# Patient Record
Sex: Female | Born: 2000 | Race: White | Hispanic: No | Marital: Single | Smoking: Never smoker
Health system: Southern US, Community
[De-identification: ages and names within clinical notes are randomized; demographics above are authoritative.]

## PROBLEM LIST (undated history)

## (undated) DIAGNOSIS — D6851 Activated protein C resistance: Secondary | ICD-10-CM

## (undated) DIAGNOSIS — S060X0A Concussion without loss of consciousness, initial encounter: Secondary | ICD-10-CM

## (undated) DIAGNOSIS — J45990 Exercise induced bronchospasm: Secondary | ICD-10-CM

## (undated) DIAGNOSIS — J45909 Unspecified asthma, uncomplicated: Secondary | ICD-10-CM

## (undated) DIAGNOSIS — G43909 Migraine, unspecified, not intractable, without status migrainosus: Secondary | ICD-10-CM

## (undated) HISTORY — DX: Migraine, unspecified, not intractable, without status migrainosus: G43.909

## (undated) HISTORY — DX: Exercise induced bronchospasm: J45.990

## (undated) HISTORY — PX: NO PAST SURGERIES: SHX2092

## (undated) HISTORY — DX: Concussion without loss of consciousness, initial encounter: S06.0X0A

## (undated) HISTORY — DX: Unspecified asthma, uncomplicated: J45.909

---
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
## 2021-01-01 NOTE — Telephone Encounter (Signed)
That's crazy. Is any other albuterol covered?

## 2021-01-01 NOTE — Telephone Encounter (Signed)
Axid 150 mg BID to control reflux please. Thank you

## 2021-01-02 ENCOUNTER — Other Ambulatory Visit: Payer: Self-pay

## 2021-01-02 MED ORDER — NIZATIDINE 150 MG PO CAPS: 150.0000 mg | ORAL_CAPSULE | Freq: Two times a day (BID) | ORAL | 5 refills | Status: DC

## 2021-01-02 NOTE — Telephone Encounter (Signed)
Left a message for patient to call the office in regards to medication change.  

## 2021-01-02 NOTE — Telephone Encounter (Signed)
It looks like just the levalbuterol is.

## 2021-01-06 NOTE — Telephone Encounter (Signed)
That makes no sense. Please order levalbuterol 2 puffs once every 6 hours as needed for shortness of breath, cough, or wheeze

## 2021-01-13 ENCOUNTER — Other Ambulatory Visit: Payer: Self-pay

## 2021-01-13 MED ORDER — LEVALBUTEROL TARTRATE 45 MCG/ACT IN AERO: 2.0000 | INHALATION_SPRAY | Freq: Four times a day (QID) | RESPIRATORY_TRACT | 1 refills | Status: DC | PRN

## 2021-06-22 ENCOUNTER — Other Ambulatory Visit: Payer: Self-pay | Admitting: Family Medicine

## 2021-12-15 ENCOUNTER — Encounter (HOSPITAL_BASED_OUTPATIENT_CLINIC_OR_DEPARTMENT_OTHER): Payer: Self-pay

## 2021-12-15 ENCOUNTER — Emergency Department (HOSPITAL_BASED_OUTPATIENT_CLINIC_OR_DEPARTMENT_OTHER)
Admission: EM | Admit: 2021-12-15 | Discharge: 2021-12-15 | Disposition: A | Discharge status: Home / Self Care | Payer: No Typology Code available for payment source | Attending: Emergency Medicine | Admitting: Emergency Medicine

## 2021-12-15 ENCOUNTER — Other Ambulatory Visit: Payer: Self-pay

## 2021-12-15 DIAGNOSIS — R55 Syncope and collapse: Secondary | ICD-10-CM | POA: Diagnosis present

## 2021-12-15 DIAGNOSIS — R109 Unspecified abdominal pain: Secondary | ICD-10-CM | POA: Diagnosis not present

## 2021-12-15 DIAGNOSIS — R110 Nausea: Secondary | ICD-10-CM | POA: Insufficient documentation

## 2021-12-15 LAB — URINALYSIS, ROUTINE W REFLEX MICROSCOPIC
Bilirubin Urine: NEGATIVE
Glucose, UA: NEGATIVE mg/dL
Hgb urine dipstick: NEGATIVE
Ketones, ur: NEGATIVE mg/dL
Leukocytes,Ua: NEGATIVE
Nitrite: NEGATIVE
Protein, ur: NEGATIVE mg/dL
Specific Gravity, Urine: >1.03 — ABNORMAL HIGH (ref 1.005–1.030)
pH: 6.5 (ref 5.0–8.0)

## 2021-12-15 LAB — BASIC METABOLIC PANEL
Anion gap: 8 (ref 5–15)
BUN: 15 mg/dL (ref 6–20)
CO2: 25 mmol/L (ref 22–32)
Calcium: 9.3 mg/dL (ref 8.9–10.3)
Chloride: 104 mmol/L (ref 98–111)
Creatinine, Ser: 0.86 mg/dL (ref 0.44–1.00)
GFR, Estimated: >60 mL/min (ref >60)
Glucose, Bld: 101 mg/dL — ABNORMAL HIGH (ref 70–99)
Potassium: 3.9 mmol/L (ref 3.5–5.1)
Sodium: 137 mmol/L (ref 135–145)

## 2021-12-15 LAB — CBC
HCT: 45.7 % (ref 36.0–46.0)
Hemoglobin: 15.9 g/dL — ABNORMAL HIGH (ref 12.0–15.0)
MCH: 32.1 pg (ref 26.0–34.0)
MCHC: 34.8 g/dL (ref 30.0–36.0)
MCV: 92.3 fL (ref 80.0–100.0)
Platelets: 180 10*3/uL (ref 150–400)
RBC: 4.95 MIL/uL (ref 3.87–5.11)
RDW: 12.2 % (ref 11.5–15.5)
WBC: 8.7 10*3/uL (ref 4.0–10.5)
nRBC: 0 % (ref 0.0–0.2)

## 2021-12-15 LAB — CBG MONITORING, ED: Glucose-Capillary: 88 mg/dL (ref 70–99)

## 2021-12-15 LAB — PREGNANCY, URINE: Preg Test, Ur: NEGATIVE

## 2021-12-15 MED ORDER — SODIUM CHLORIDE 0.9 % IV BOLUS
1000.0000 mL | Freq: Once | INTRAVENOUS | Status: AC
  Administered 2021-12-15: 1000 mL via INTRAVENOUS

## 2021-12-15 MED ORDER — SODIUM CHLORIDE 0.9 % IV SOLN: 1.0000 g | Freq: Once | INTRAVENOUS | Status: DC

## 2021-12-15 NOTE — ED Triage Notes (Signed)
Pt reports near syncopal episode ~1hour PTA, ad pain, nausea after event-NAD-steady gait ?

## 2021-12-15 NOTE — ED Notes (Signed)
C/o nausea at this time, last PO intake at approx 0830hr today ?

## 2021-12-15 NOTE — ED Notes (Signed)
Pt ambulatory to restroom. Steady gait. No issues observed.  °

## 2021-12-15 NOTE — Discharge Instructions (Signed)
Drink plenty of fluids.  Return if any problems.   

## 2021-12-19 NOTE — ED Provider Notes (Signed)
?[facility] EMERGENCY DEPARTMENT ?Provider Note ? ? ?CSN: [id number] ?Arrival date & time: 12/15/21  1059 ? ?  ? ?History ? ?Chief Complaint  ?Patient presents with  ? Near Syncope  ? ? ?Veronica Villegas is a 21 y.o. female. ? ?Pt reports she felt like she was going to pass out.  Pt report she felt like she was going to pass out.  Pt reports she felt nauseated and had some abdominal cramping.  Pt denies any current symptoms.  Pt reports she did not any chest pain.  Patient denies any headache she has not had any vomiting she denies any diarrhea patient denies any cough. ? ?The history is provided by the patient. No language interpreter was used.  ?Near Syncope ?This is a new problem. The problem occurs constantly. The problem has not changed since onset.Pertinent negatives include no chest pain, no abdominal pain, no headaches and no shortness of breath. Nothing aggravates the symptoms. Nothing relieves the symptoms. She has tried nothing for the symptoms.  ? ?  ? ?Home Medications ?Prior to Admission medications   ?Medication Sig Start Date End Date Taking? Authorizing Provider  ?levalbuterol [facility] HFA) 45 MCG/ACT inhaler Inhale 2 puffs into the lungs every 6 (six) hours as needed for wheezing or shortness of breath. 01/13/21   Ambs, Norvel Richards, FNP  ?albuterol (PROVENTIL) (2.5 MG/3ML) 0.083% nebulizer solution Take 3 mLs (2.5 mg total) by nebulization every 4 (four) hours as needed for wheezing or shortness of breath. 12/30/20   Hetty Blend, FNP  ?cyclobenzaprine (FLEXERIL) 10 MG tablet Take 1 tablet 2 hours before bedtime (start after finishing Klonopin) 11/10/18   [provider]  ?famotidine (PEPCID) 20 MG tablet Take 1 tablet twice daily to prevent reflux 12/30/20   Ambs, Norvel Richards, FNP  ?fluticasone (FLONASE) 50 MCG/ACT nasal spray 1-2 sprays per nostril once a day as needed for a stuffy nose 12/30/20   Ambs, Norvel Richards, FNP  ?fluticasone (FLOVENT HFA) 110 MCG/ACT inhaler Inhale 2 puffs twice a day with  spacer ?Patient not taking: Reported on 12/30/2020 05/02/20   Nehemiah Settle, FNP  ?Fluticasone Propionate,sensor, (ARMONAIR DIGIHALER) 113 MCG/ACT AEPB 2 puffs twice a day to prevent coughing or wheezing. 12/30/20   Hetty Blend, FNP  ?magnesium oxide (MAG-OX) 400 MG tablet Take 400 mg by mouth daily. ?Patient not taking: Reported on 12/30/2020    [provider]  ?montelukast (SINGULAIR) 10 MG tablet TAKE 1 TABLET ONCE A DAY FOR COUGHING OR WHEEZING. 12/17/20   Ambs, Norvel Richards, FNP  ?nizatidine (AXID) 150 MG capsule Take 1 capsule (150 mg total) by mouth 2 (two) times daily. 01/02/21   Hetty Blend, FNP  ?   ? ?Allergies    ?Patient has no known allergies.   ? ?Review of Systems   ?Review of Systems  ?Respiratory:  Negative for shortness of breath.   ?Cardiovascular:  Positive for near-syncope. Negative for chest pain.  ?Gastrointestinal:  Negative for abdominal pain.  ?Neurological:  Negative for headaches.  ?All other systems reviewed and are negative. ? ?Physical Exam ?Updated Vital Signs ?BP 115/75   Pulse 64   Temp 98 ?F (36.7 ?C) (Oral)   Resp 16   Ht 5\' 7"  (1.702 m)   Wt 69.4 kg   LMP 11/28/2021   SpO2 100%   BMI 23.96 kg/m?  ?Physical Exam ?Vitals and nursing note reviewed.  ?Constitutional:   ?   Appearance: She is well-developed.  ?HENT:  ?  Head: Normocephalic.  ?   Nose: Nose normal.  ?   Mouth/Throat:  ?   Mouth: Mucous membranes are moist.  ?Eyes:  ?   Extraocular Movements: Extraocular movements intact.  ?   Pupils: Pupils are equal, round, and reactive to light.  ?Cardiovascular:  ?   Rate and Rhythm: Normal rate.  ?Pulmonary:  ?   Effort: Pulmonary effort is normal.  ?Abdominal:  ?   General: Abdomen is flat. There is no distension.  ?Musculoskeletal:     ?   General: Normal range of motion.  ?   Cervical back: Normal range of motion.  ?Skin: ?   General: Skin is warm.  ?Neurological:  ?   General: No focal deficit present.  ?   Mental Status: She is alert and oriented to person, place,  and time.  ? ? ?ED Results / Procedures / Treatments   ?Labs ?(all labs ordered are listed, but only abnormal results are displayed) ?Labs Reviewed  ?BASIC METABOLIC PANEL - Abnormal; Notable for the following components:  ?    Result Value  ? Glucose, Bld 101 (*)   ? All other components within normal limits  ?CBC - Abnormal; Notable for the following components:  ? Hemoglobin 15.9 (*)   ? All other components within normal limits  ?URINALYSIS, ROUTINE W REFLEX MICROSCOPIC - Abnormal; Notable for the following components:  ? Specific Gravity, Urine >1.030 (*)   ? All other components within normal limits  ?PREGNANCY, URINE  ?CBG MONITORING, ED  ? ? ?EKG ?EKG Interpretation ? ?Date/Time:  Monday December 15 2021 11:24:38 EDT ?Ventricular Rate:  75 ?PR Interval:  138 ?QRS Duration: 84 ?QT Interval:  368 ?QTC Calculation: 410 ?R Axis:   71 ?Text Interpretation: Normal sinus rhythm with sinus arrhythmia Nonspecific T wave abnormality Abnormal ECG When compared with ECG of 18-May-2019 22:43, No significant change since last tracing Confirmed by Linwood Dibbles [phone]) on 12/15/2021 11:32:32 AM ? ?Radiology ?No results found. ? ?Procedures ?Procedures  ? ? ?Medications Ordered in ED ?Medications  ?sodium chloride 0.9 % bolus 1,000 mL (0 mLs Intravenous Stopped 12/15/21 1308)  ? ? ?ED Course/ Medical Decision Making/ A&P ?  ?                        ?Medical Decision Making ?Patient reports feeling lightheaded and almost passing out ? ?Amount and/or Complexity of Data Reviewed ?Independent Historian: parent ?   Details: Patient is here with parent who reports patient has been acting normally ?Labs: ordered. Decision-making details documented in ED Course. ?   Details: Pregnancy is negative hemoglobin is slightly elevated at 15.9 ? ?Risk ?Risk Details: Counseled on results she is advised to follow-up with primary care patient is encouraged to drink lots of fluids she is to return to the department if symptoms worsen or  change ? ? ? ? ? ? ? ? ? ? ?Final Clinical Impression(s) / ED Diagnoses ?Final diagnoses:  ?Near syncope  ? ? ?Rx / DC Orders ?ED Discharge Orders   ? ? None  ? ?  ? ? ?  ?Elson Areas, [place] ?12/19/21 1455 ? ?  ?Linwood Dibbles, MD ?12/23/21 1456 ? ?

## 2022-01-10 ENCOUNTER — Other Ambulatory Visit: Payer: Self-pay | Admitting: Family Medicine

## 2022-02-03 ENCOUNTER — Other Ambulatory Visit: Payer: Self-pay | Admitting: Family Medicine

## 2022-03-10 ENCOUNTER — Other Ambulatory Visit: Payer: Self-pay | Admitting: Family Medicine

## 2022-03-11 NOTE — Patient Instructions (Addendum)
Asthma  Continue montelukast 10 mg once a day to help prevent cough and wheeze. Continue levalbuterol 2 puffs every 4 hours as needed for cough, wheeze, tightness in chest, or shortness of breath. You may use levalbuterol 5-15 minutes before activity to decrease cough or wheeze For asthma flare, begin Flovent 110-2 puffs twice a day with a spacer for 2 weeks or until cough and wheeze free  Chronic rhinitis Continue Zyrtec (cetirizine) 10 mg once a day as needed for runny nose or itching. Continue Flonase 1-2 sprays in each nostril once a day as needed for a stuffy nose.  In the right nostril, point the applicator out toward the right ear. In the left nostril, point the applicator out toward the left ear Consider saline nasal rinses as needed for nasal symptoms. Use this before any medicated nasal sprays for best result Consider updating your environmental allergy testing.  Remember to stop antihistamines for 3 days before your testing appointment.  Hives (urticaria) Well controlledUse the lowest amount of medication while remaining hive free Cetirizine (Zyrtec) 10mg  twice a day and famotidine (Pepcid) 20 mg twice a day. If no symptoms for 7-14 days then decrease to. Cetirizine (Zyrtec) 10mg  twice a day and famotidine (Pepcid) 20 mg once a day.  If no symptoms for 7-14 days then decrease to. Cetirizine (Zyrtec) 10mg  twice a day.  If no symptoms for 7-14 days then decrease to. Cetirizine (Zyrtec) 10mg  once a day.  May use Benadryl (diphenhydramine) as needed for breakthrough hives       If symptoms return, then step up dosage If your symptoms re-occur, begin a journal of events that occurred for up to 6 hours before your symptoms began including foods and beverages consumed, soaps or perfumes you had contact with, and medications.   Call the clinic if this treatment plan is not working well for you  Follow up in 6 months or sooner if needed.

## 2022-03-11 NOTE — Progress Notes (Unsigned)
   [address] [place] [address] Dept: [phone]  FOLLOW UP NOTE  Patient ID: Veronica Villegas, female    DOB: May 11, 2001  Age: 21 y.o. MRN: [id number] Date of Office Visit: 03/12/2022  Assessment  Chief Complaint: No chief complaint on file.  HPI Veronica Villegas is a 21 year old female who presents to the clinic for a follow up visit. She was last seen in this clinic on 12/30/2020 by Thermon Leyland, FNP for evaluation of asthma, allergic rhinitis, urticaria, and reflux.   Drug Allergies:  No Known Allergies  Physical Exam: There were no vitals taken for this visit.   Physical Exam  Diagnostics:    Assessment and Plan: No diagnosis found.  No orders of the defined types were placed in this encounter.   There are no Patient Instructions on file for this visit.  No follow-ups on file.    Thank you for the opportunity to care for this patient.  Please do not hesitate to contact me with questions.  Thermon Leyland, FNP [facility]

## 2022-03-12 ENCOUNTER — Ambulatory Visit (INDEPENDENT_AMBULATORY_CARE_PROVIDER_SITE_OTHER): Payer: No Typology Code available for payment source | Admitting: Family Medicine

## 2022-03-12 ENCOUNTER — Encounter: Payer: Self-pay | Admitting: Family Medicine

## 2022-03-12 VITALS — BP 118/64 | HR 84 | Temp 98.8°F | Resp 18 | Ht 67.0 in | Wt 152.0 lb

## 2022-03-12 DIAGNOSIS — J4540 Moderate persistent asthma, uncomplicated: Secondary | ICD-10-CM | POA: Diagnosis not present

## 2022-03-12 DIAGNOSIS — J3089 Other allergic rhinitis: Secondary | ICD-10-CM | POA: Diagnosis not present

## 2022-03-12 DIAGNOSIS — L282 Other prurigo: Secondary | ICD-10-CM | POA: Diagnosis not present

## 2022-03-12 MED ORDER — MONTELUKAST SODIUM 10 MG PO TABS: ORAL_TABLET | ORAL | 0 refills | Status: DC

## 2022-03-12 MED ORDER — LEVALBUTEROL TARTRATE 45 MCG/ACT IN AERO
2.0000 | INHALATION_SPRAY | Freq: Four times a day (QID) | RESPIRATORY_TRACT | 1 refills | Status: DC | PRN
Start: 2022-03-12 — End: 2022-06-22

## 2022-03-12 MED ORDER — FLUTICASONE PROPIONATE HFA 110 MCG/ACT IN AERO: INHALATION_SPRAY | RESPIRATORY_TRACT | 5 refills | Status: DC

## 2022-04-03 ENCOUNTER — Other Ambulatory Visit: Payer: Self-pay | Admitting: Family Medicine

## 2022-04-24 ENCOUNTER — Other Ambulatory Visit: Payer: Self-pay | Admitting: Family Medicine

## 2022-06-01 ENCOUNTER — Other Ambulatory Visit: Payer: Self-pay | Admitting: Family Medicine

## 2022-06-01 NOTE — Telephone Encounter (Signed)
Please advise to change to pulmicort as it is preferred

## 2022-06-08 ENCOUNTER — Other Ambulatory Visit: Payer: Self-pay

## 2022-06-08 MED ORDER — BUDESONIDE 90 MCG/ACT IN AEPB: INHALATION_SPRAY | RESPIRATORY_TRACT | 5 refills | Status: DC

## 2022-06-08 NOTE — Telephone Encounter (Signed)
Alternative of pulmicort was sent to pharmacy. Per anne.

## 2022-06-08 NOTE — Telephone Encounter (Signed)
Alternative meds per Webb Silversmith was sent to pt pharmacy.

## 2022-06-08 NOTE — Telephone Encounter (Signed)
For asthma flare, begin Pulmicort 90- 2 puffs twice a day for 2 weeks or until cough and wheeze free, then stop. Thank you

## 2022-06-19 ENCOUNTER — Other Ambulatory Visit: Payer: Self-pay | Admitting: Family Medicine

## 2022-06-23 ENCOUNTER — Other Ambulatory Visit: Payer: Self-pay | Admitting: Family Medicine

## 2022-06-23 NOTE — Telephone Encounter (Signed)
Albuterol inhaler is on backorder. Please advise on switching to the levalbuterol inhaler .

## 2022-06-24 NOTE — Telephone Encounter (Signed)
Please switch to levalbuterol

## 2022-06-29 ENCOUNTER — Other Ambulatory Visit: Payer: Self-pay | Admitting: Family Medicine

## 2022-07-15 ENCOUNTER — Encounter: Payer: Self-pay | Admitting: Internal Medicine

## 2022-07-15 ENCOUNTER — Ambulatory Visit (INDEPENDENT_AMBULATORY_CARE_PROVIDER_SITE_OTHER): Payer: No Typology Code available for payment source | Admitting: Internal Medicine

## 2022-07-15 VITALS — BP 112/70 | HR 75 | Temp 97.9°F | Resp 18 | Wt 151.2 lb

## 2022-07-15 DIAGNOSIS — R21 Rash and other nonspecific skin eruption: Secondary | ICD-10-CM

## 2022-07-15 DIAGNOSIS — L505 Cholinergic urticaria: Secondary | ICD-10-CM

## 2022-07-15 MED ORDER — TRIAMCINOLONE ACETONIDE 0.1 % EX OINT
1.0000 | TOPICAL_OINTMENT | Freq: Two times a day (BID) | CUTANEOUS | 3 refills | Status: DC
Start: 2022-07-15 — End: 2023-04-29

## 2022-07-15 MED ORDER — FEXOFENADINE HCL 180 MG PO TABS: 180.0000 mg | ORAL_TABLET | Freq: Two times a day (BID) | ORAL | 5 refills | Status: DC

## 2022-07-15 NOTE — Patient Instructions (Addendum)
Rash - Do a daily soaking tub bath in warm water for 10-15 minutes.  - Use a gentle, unscented cleanser at the end of the bath (such as Dove unscented bar or baby wash, or Aveeno sensitive body wash). Then rinse, pat half-way dry, and apply a gentle, unscented moisturizer cream or ointment (Cerave, Cetaphil, Eucerin, Aveeno) all over while still damp. Dry skin makes the itching and rash of eczema worse. The skin should be moisturized with a gentle, unscented moisturizer at least twice daily.  - Use only unscented liquid laundry detergent. - Apply prescribed topical steroid (triamcinolone 0.1% below neck) to flared areas after the moisturizer has soaked into the skin (wait at least 30 minutes). Taper off the topical steroids as the skin improves. Do not use topical steroid for more than 7-10 days at a time.  - Start Allegra 180mg  twice daily.  Return in about 8 weeks (around 09/09/2022).

## 2022-07-15 NOTE — Progress Notes (Signed)
   FOLLOW UP Date of Service/Encounter:  07/15/22   Subjective:  Veronica Villegas (DOB: December 22, 2000) is a 21 y.o. female who returns to the [facility] on 07/15/2022 for an acute visit for rash.  Last seen 02/2022 with Gareth Morgan for moderate persistent asthma, allergic rhinitis and urticaria.    History obtained from: chart review and patient. Reports onset of rash about 1 day ago.  It is bumpy and red, sometimes itchy.  It is mostly on her upper/lower back, arms and neck.  Has used benadryl x1 without improvement.  She uses [place] lotion to moisturize.  No new products or medications.  No illness.  No insect bites.   Reports asthma and rhinitis are stable.  Past Medical History: Past Medical History:  Diagnosis Date   Asthma    Concussion with no loss of consciousness    Exercise induced bronchospasm    Factor 5 Leiden mutation, heterozygous ([place])    dx'd 2wks by headache clinic   Migraines     Objective:  BP 112/70 (BP Location: Right Arm, Patient Position: Sitting, Cuff Size: Normal)   Pulse 75   Temp 97.9 F (36.6 C) (Temporal)   Resp 18   Wt 151 lb 3.2 oz (68.6 kg)   SpO2 100%   BMI 23.68 kg/m  Body mass index is 23.68 kg/m. Physical Exam: GEN: alert, well developed HEENT: clear conjunctiva, TM grey and translucent, nose with moderate inferior turbinate hypertrophy, pink  nasal mucosa, no rhinorrhea, no cobblestoning HEART: regular rate and rhythm, no murmur LUNGS: clear to auscultation bilaterally, no coughing, unlabored respiration SKIN: papular rash on back, neck and arms, dry skin present    Assessment/Plan   Rash - Unclear etiology, possibly urticarial or keratosis pilaris or contact dermatitis. If persistent, might need to consider patch testing  - Do a daily soaking tub bath in warm water for 10-15 minutes.  - Use a gentle, unscented cleanser at the end of the bath (such as Dove unscented bar or baby wash, or Aveeno sensitive body wash). Then  rinse, pat half-way dry, and apply a gentle, unscented moisturizer cream or ointment (Cerave, Cetaphil, Eucerin, Aveeno) all over while still damp. Dry skin makes the itching and rash of eczema worse. The skin should be moisturized with a gentle, unscented moisturizer at least twice daily.  - Use only unscented liquid laundry detergent. - Apply prescribed topical steroid (triamcinolone 0.1% below neck) to flared areas after the moisturizer has soaked into the skin (wait at least 30 minutes). Taper off the topical steroids as the skin improves. Do not use topical steroid for more than 7-10 days at a time.  - Start Allegra 180mg  twice daily.   Return in about 8 weeks (around 09/09/2022). Harlon Flor, MD  [facility]

## 2022-09-09 ENCOUNTER — Ambulatory Visit: Payer: No Typology Code available for payment source | Admitting: Internal Medicine

## 2022-11-09 ENCOUNTER — Other Ambulatory Visit: Payer: Self-pay | Admitting: Family Medicine

## 2023-04-29 ENCOUNTER — Ambulatory Visit: Payer: No Typology Code available for payment source | Admitting: Internal Medicine

## 2023-04-29 ENCOUNTER — Encounter: Payer: Self-pay | Admitting: Internal Medicine

## 2023-04-29 VITALS — BP 90/54 | HR 82 | Temp 98.1°F | Resp 16 | Ht 67.0 in | Wt 149.5 lb

## 2023-04-29 DIAGNOSIS — J3089 Other allergic rhinitis: Secondary | ICD-10-CM

## 2023-04-29 DIAGNOSIS — J4540 Moderate persistent asthma, uncomplicated: Secondary | ICD-10-CM | POA: Diagnosis not present

## 2023-04-29 MED ORDER — RYALTRIS 665-25 MCG/ACT NA SUSP: 1.0000 | Freq: Two times a day (BID) | NASAL | 11 refills | Status: AC

## 2023-04-29 MED ORDER — LEVALBUTEROL TARTRATE 45 MCG/ACT IN AERO: 2.0000 | INHALATION_SPRAY | RESPIRATORY_TRACT | 1 refills | Status: DC | PRN

## 2023-04-29 NOTE — Progress Notes (Signed)
FOLLOW UP Date of Service/Encounter:  04/29/23  Subjective:  Veronica Villegas (DOB: May 04, 2001) is a 22 y.o. female who [facility] in re-evaluation of the following: moderate persistent asthma, allergic rhinitis and urticaria  History obtained from: chart review and patient.  For Review, LV was on 07/15/22  with Dr. Allena Katz seen for acute visit for rash-possible urticarial or KP or contact dermatitis . See below for summary of history and diagnostics.   Therapeutic plans/changes recommended: Start allegra BID and triamcinolone 0.1% BID PRN ----------------------------------------------------- Pertinent History/Diagnostics:  Asthma: Current meds: montelukast, levoalbuterol PRN, flovent 110 in block therapy Chronic Rhinitis:  Current meds: PRN zyrtec, flonase, saline rinses Urticaria:  Hx of urticaria controlled on high dose antihistamines. --------------------------------------------------- Today presents for follow-up. Asthma: she does not use any medications on a regular basis, does use xopenex as needed. Last needed a few weeks ago.  She uses her rescue inhaler around 1-2 times per month at most.  She has not used her nebulizer in many months. She is no longer on montelukast in probably over a year, without a notable difference in her symptoms. No ED visits, systemic steroids since last visit.  Chronic rhinitis: Her nose has been bothering her a lot. She is going to [place] for school this year to finish pre-pharmacy.  Her nose has been congested, and she is unclear why.  She has never used a nasal spray.  She doesn't really use zyrtec.  She does feel her allergies got worse last spring, but have now subsided.   Hives: She has had no episodes of hives. Her previous episode resolved in one week and she has had none since.   All medications reviewed by clinical staff and updated in chart. No new pertinent medical or surgical history except as noted  in HPI.  ROS: All others negative except as noted per HPI.   Objective:  BP (!) 90/54   Pulse 82   Temp 98.1 F (36.7 C) (Temporal)   Resp 16   Ht 5\' 7"  (1.702 m)   Wt 149 lb 8 oz (67.8 kg)   SpO2 98%   BMI 23.42 kg/m  Body mass index is 23.42 kg/m. Physical Exam: General Appearance:  Alert, cooperative, no distress, appears stated age  Head:  Normocephalic, without obvious abnormality, atraumatic  Eyes:  Conjunctiva clear, EOM's intact  Ears EACs normal bilaterally and normal TMs bilaterally  Nose: Nares normal, hypertrophic turbinates, normal mucosa, and no visible anterior polyps  Throat: Lips, tongue normal; teeth and gums normal, normal posterior oropharynx  Neck: Supple, symmetrical  Lungs:   clear to auscultation bilaterally, Respirations unlabored, no coughing  Heart:  regular rate and rhythm and no murmur, Appears well perfused  Extremities: No edema  Skin: Skin color, texture, turgor normal and no rashes or lesions on visualized portions of skin  Neurologic: No gross deficits   Labs:  Lab Orders  No laboratory test(s) ordered today    Spirometry:  Tracings reviewed. Her effort: Good reproducible efforts. FVC: 3.71L FEV1: 3.24L, 88% predicted FEV1/FVC ratio: 99% Interpretation: Spirometry consistent with normal pattern.  Please see scanned spirometry results for details.   Assessment/Plan   Asthma - at goal Your breathing test looked great. Continue levalbuterol 2 puffs or 1 vial via nebulizer every 4 hours as needed for cough, wheeze, tightness in chest, or shortness of breath. You may use levalbuterol 5-15 minutes before activity to decrease cough or wheeze  Chronic rhinitis - not  at goal Consider Zyrtec (cetirizine) 10 mg once a day as needed for runny nose or itching. Consider Ryaltris nasal spray 1-2 sprays in each nostril twice daily as needed for congestion.  - let me know if not covered, can try to send dymista or alternatively you can buy  flonase and use 2 sprays daily as needed and azelastine 1-2 sprays twice daily as needed. Consider saline nasal rinses as needed for nasal symptoms. Use this before any medicated nasal sprays for best result  Call the clinic if this treatment plan is not working well for you  Follow up in 12 months or sooner if needed. It was a pleasure meeting you in clinic today! Thank you for allowing me to participate in your care.  Other: samples provided of: ryaltris  Tonny Bollman, MD  [facility]

## 2023-04-29 NOTE — Patient Instructions (Addendum)
Asthma  Your breathing test looked great. Continue levalbuterol 2 puffs or 1 vial via nebulizer every 4 hours as needed for cough, wheeze, tightness in chest, or shortness of breath. You may use levalbuterol 5-15 minutes before activity to decrease cough or wheeze  Chronic rhinitis Consider Zyrtec (cetirizine) 10 mg once a day as needed for runny nose or itching. Consider Ryaltris nasal spray 1-2 sprays in each nostril twice daily as needed for congestion.  - let me know if not covered, can try to send dymista or alternatively you can buy flonase and use 2 sprays daily as needed and azelastine 1-2 sprays twice daily as needed. Consider saline nasal rinses as needed for nasal symptoms. Use this before any medicated nasal sprays for best result  Call the clinic if this treatment plan is not working well for you  Follow up in 12 months or sooner if needed. It was a pleasure meeting you in clinic today! Thank you for allowing me to participate in your care.  Tonny Bollman, MD [facility]

## 2023-11-07 ENCOUNTER — Other Ambulatory Visit: Payer: Self-pay | Admitting: Internal Medicine

## 2024-01-15 ENCOUNTER — Other Ambulatory Visit: Payer: Self-pay | Admitting: Internal Medicine

## 2024-03-23 ENCOUNTER — Other Ambulatory Visit: Payer: Self-pay | Admitting: Internal Medicine

## 2024-06-08 ENCOUNTER — Other Ambulatory Visit: Payer: Self-pay | Admitting: Internal Medicine
